# Patient Record
Sex: Female | Born: 1965 | Race: White | Hispanic: No | Smoking: Current every day smoker
Health system: Southern US, Community
[De-identification: ages and names within clinical notes are randomized; demographics above are authoritative.]

## PROBLEM LIST (undated history)

## (undated) DIAGNOSIS — Z800 Family history of malignant neoplasm of digestive organs: Secondary | ICD-10-CM

## (undated) DIAGNOSIS — E079 Disorder of thyroid, unspecified: Secondary | ICD-10-CM

## (undated) DIAGNOSIS — Z9221 Personal history of antineoplastic chemotherapy: Secondary | ICD-10-CM

## (undated) DIAGNOSIS — Z8543 Personal history of malignant neoplasm of ovary: Secondary | ICD-10-CM

## (undated) DIAGNOSIS — C569 Malignant neoplasm of unspecified ovary: Secondary | ICD-10-CM

## (undated) DIAGNOSIS — F419 Anxiety disorder, unspecified: Secondary | ICD-10-CM

## (undated) HISTORY — PX: COLONOSCOPY: SHX174

## (undated) HISTORY — DX: Family history of malignant neoplasm of digestive organs: Z80.0

## (undated) HISTORY — DX: Personal history of antineoplastic chemotherapy: Z92.21

## (undated) HISTORY — DX: Anxiety disorder, unspecified: F41.9

## (undated) HISTORY — DX: Personal history of malignant neoplasm of ovary: Z85.43

## (undated) HISTORY — DX: Disorder of thyroid, unspecified: E07.9

---

## 1985-01-20 HISTORY — PX: OTHER SURGICAL HISTORY: SHX169

## 1997-10-16 ENCOUNTER — Other Ambulatory Visit: Admission: RE | Admit: 1997-10-16 | Discharge: 1997-10-16 | Payer: Self-pay | Admitting: Obstetrics and Gynecology

## 1998-08-30 ENCOUNTER — Inpatient Hospital Stay (HOSPITAL_COMMUNITY): Admission: AD | Admit: 1998-08-30 | Discharge: 1998-08-30 | Payer: Self-pay | Admitting: Obstetrics and Gynecology

## 1998-09-23 ENCOUNTER — Inpatient Hospital Stay (HOSPITAL_COMMUNITY): Admission: AD | Admit: 1998-09-23 | Discharge: 1998-09-23 | Payer: Self-pay | Admitting: Obstetrics and Gynecology

## 1998-10-07 ENCOUNTER — Inpatient Hospital Stay (HOSPITAL_COMMUNITY): Admission: AD | Admit: 1998-10-07 | Discharge: 1998-10-09 | Payer: Self-pay | Admitting: Obstetrics and Gynecology

## 1998-11-06 ENCOUNTER — Other Ambulatory Visit: Admission: RE | Admit: 1998-11-06 | Discharge: 1998-11-06 | Payer: Self-pay | Admitting: Obstetrics and Gynecology

## 2000-01-21 HISTORY — PX: TOTAL ABDOMINAL HYSTERECTOMY: SHX209

## 2000-04-01 ENCOUNTER — Other Ambulatory Visit: Admission: RE | Admit: 2000-04-01 | Discharge: 2000-04-01 | Payer: Self-pay | Admitting: Obstetrics and Gynecology

## 2000-09-17 ENCOUNTER — Encounter (INDEPENDENT_AMBULATORY_CARE_PROVIDER_SITE_OTHER): Payer: Self-pay

## 2000-09-18 ENCOUNTER — Inpatient Hospital Stay (HOSPITAL_COMMUNITY): Admission: RE | Admit: 2000-09-18 | Discharge: 2000-09-19 | Payer: Self-pay | Admitting: Obstetrics and Gynecology

## 2001-04-20 ENCOUNTER — Other Ambulatory Visit: Admission: RE | Admit: 2001-04-20 | Discharge: 2001-04-20 | Payer: Self-pay | Admitting: Obstetrics and Gynecology

## 2002-08-04 ENCOUNTER — Other Ambulatory Visit: Admission: RE | Admit: 2002-08-04 | Discharge: 2002-08-04 | Payer: Self-pay | Admitting: Obstetrics and Gynecology

## 2005-01-22 ENCOUNTER — Other Ambulatory Visit: Admission: RE | Admit: 2005-01-22 | Discharge: 2005-01-22 | Payer: Self-pay | Admitting: Obstetrics and Gynecology

## 2008-10-25 ENCOUNTER — Ambulatory Visit: Payer: Self-pay | Admitting: Internal Medicine

## 2008-11-06 ENCOUNTER — Ambulatory Visit: Payer: Self-pay | Admitting: Internal Medicine

## 2009-06-08 ENCOUNTER — Telehealth: Payer: Self-pay | Admitting: Internal Medicine

## 2010-02-21 NOTE — Progress Notes (Signed)
Summary: Schedule Colonoscopy  Phone Note Outgoing Call Call back at [facility] Phone [phone]   Call placed by: Harlow Mares CMA Duncan Dull),  Jun 08, 2009 10:29 AM Call placed to: Patient Summary of Call: Left message on patients machine to call back. patient is due for colonoscopy, she NOS and cx her colonoscopy last year.  Initial call taken by: Harlow Mares CMA Duncan Dull),  Jun 08, 2009 10:30 AM  Follow-up for Phone Call        Left message on patients machine to call back. we will mail her a reminder letter to schedule her colonoscopy Follow-up by: Harlow Mares CMA Duncan Dull),  July 02, 2009 12:43 PM

## 2010-06-07 NOTE — H&P (Signed)
[facility]  Patient:    Rhonda Strickland, H[place] Visit Number: [id number] MRN: [id number]          Service Type: Attending:  Juluis Mire, M.D. Dictated by:   Juluis Mire, M.D. Adm. Date:  09/17/00                           History and Physical  CHIEF COMPLAINT:              The patient is a 45 year old gravida 5 para 3 abortus 2 married white female, who presents for laparoscopically assisted vaginal hysterectomy with left salpingo-oophorectomy.  HISTORY OF PRESENT ILLNESS:   In relation to the present admission, the patient reports abnormal uterine bleeding.  She describes regular cycles.  Her flow lasts seven days.  She describes seven days of heavy flow, changing pads and tampons every hour to 1-1/2 hours.  She denies any significant clots.  She does have pain and discomfort associated with her periods.  This pain and abnormal bleeding is limiting at times, causing he to miss work.  She is using over-the-counter agents without much response.  We are limited somewhat because she does smoke and therefore birth control pills are contraindicated. She underwent an ultrasound evaluation that revealed overall increase in uterine size suggestive of adenomyosis.  In view of our inability to treat her hormonally and the continued issue with her periods she presents now for definitive therapy in the form of laparoscopically-assisted vaginal hysterectomy.  PAST MEDICAL HISTORY:         Previous right salpingo-oophorectomy done in 1988.  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  She underwent subsequent chemotherapy.  We had talked to the [facility] gynecologic oncologist, who stated that she did not need prophylactic surgery for this.  However, the patient does have basic concerns and wants to the remaining ovary removed at this time.  She does understand the need for hormone replacement therapy.  The  recent issues related to recent studies in terms of risk of hormone replacement therapy have been discussed.  ALLERGIES:                    No known drug allergies.  MEDICATIONS:                  Xanax.  PAST MEDICAL HISTORY:         Usual childhood diseases without significant sequelae.  PAST SURGICAL HISTORY:        1. Wisdom teeth removed.                               2. In 1988 underwent exploratory laparotomy                                  with removal of right tube and ovary.  PAST OBSTETRICAL HISTORY:     1. Two TABs.                               2. Three spontaneous vaginal deliveries.  FAMILY HISTORY:               Basically unremarkable.  SOCIAL HISTORY:  Half a pack per day tobacco use.  No alcohol use.  REVIEW OF SYSTEMS:            Noncontributory.  PHYSICAL EXAMINATION:  VITAL SIGNS:                  Afebrile with stable vital signs.  HEENT:                        Normocephalic.  PERRLA.  EOMI.  Sclerae and conjunctivae clear.  Oropharynx clear.  NECK:                         Without thyromegaly.  BREAST:                       No discrete masses.  LUNGS:                        Clear.  CARDIAC:                      Regular rate and rhythm.  No murmurs or gallops.  ABDOMEN:                      Benign.  No masses, organomegaly, or tenderness. Did have a midline incision from previous exploratory surgery.  PELVIC:                       Normal external genitalia.  Vaginal mucosa clear.  Cervix unremarkable.  Uterus upper limits of normal size, posterior. Adnexa unremarkable.  EXTREMITIES:                  Trace edema.  NEUROLOGIC:                   Grossly within normal limits.  IMPRESSION:                   1. Menorrhagia and increasing pelvic pain,                                  possible uterine adenomyosis versus pelvic                                  endometriosis.                               2. Previous right  salpingo-oophorectomy with                                  removal of an immature teratoma.  PLAN:                         The patient will undergo attempt at laparoscopically assisted vaginal hysterectomy with left salpingo-oophorectomy.  She does understand that due to the midline adhesion and possible adhesions a vaginal approach may be contraindicated and abdominal surgery may be required.  The risks of surgery have been discussed including the risks of anesthesia, the risk of infection, the risk of hemorrhage that could necessitate transfusion with the risk of AIDS  or hepatitis, risk of injury to adjacent organs including bladder, bowel, or ureters that could require further exploratory surgery, the risk of deep vein thrombosis and pulmonary embolus.  She also understands the need for hormone replacement therapy with its risks and benefits. Dictated by:   Juluis Mire, M.D. Attending:  Juluis Mire, M.D. DD:  09/17/00 TD:  09/17/00 Job: [id number]

## 2010-06-07 NOTE — Discharge Summary (Signed)
[facility]  Patient:    Rhonda Strickland, Rhonda Strickland Visit Number: [id number] MRN: [id number]          Service Type: GYN Location: 9300 [id number] Attending Physician:  Frederich Balding Dictated by:   Juluis Mire, M.D. Admit Date:  09/17/2000 Disc. Date: 09/19/00                             Discharge Summary  ADMISSION DIAGNOSES:          1. Menorrhagia and dysmenorrhea secondary to                                  pelvic endometriosis, possibly uterine                                  adenomyosis.                               2. Previous history of an immature teratoma with                                  subsequent right salpingo-oophorectomy.  POSTOPERATIVE DIAGNOSES:       1. Menorrhagia and dysmenorrhea secondary to                                  pelvic endometriosis, possibly uterine                                  adenomyosis.                               2. Previous history of an immature teratoma with                                  subsequent right salpingo-oophorectomy.  OPERATIVE PROCEDURES:         1. Laparoscopically-assisted vaginal                                  hysterectomy.                               2. Left salpingo-oophorectomy.  HISTORY OF PRESENT ILLNESS:   For summary of admission history and physical, please see the dictated note.  HOSPITAL COURSE:              The patient underwent the above-noted surgery. Pathology revealed a 78 g uterus.  The uterus, tubes, and ovaries were unremarkable.  She did have evidence of pelvic endometriosis intraoperatively. Postoperatively, she did well.  Her postoperative hemoglobin was 11.4 with a white count of 7700.  She was discharged home on her second postoperative day. It was of note that she did run a fever during the evening of her second postoperative day up to a maximum of  100.6 degrees.  That morning, it was 99.5 degrees.  She did have a history of tobacco use and came in  with a mild bronchitis.  On evaluation, she did have diffuse rales and rhonchi consistent with ongoing bronchitis.  Her abdomen was soft and nontender.  Bowel sounds were active.  She had normal bowel function postoperatively.  No urinary complaints.  No CVA tenderness.  She had no active vaginal bleeding at this point in time.  It was felt that the temperature was pulmonary in origin and we are going to go ahead with the discharge despite the previous elevation of temperature.  In terms of complications, none were encountered during her stay in the hospital and she was discharged home in stable condition.  DISPOSITION:                  The patient is discharged home.  She will complete a course of ciprofloxacin for the bronchitis.  She has an inhaler at home already.  She is also discharged home on Tylox as needed for pain and a Climara estrogen patch.  DISCHARGE INSTRUCTIONS:       She is to avoid heavy lifting, vaginal entry, and driving a car.  She will watch her fever closely.  Persistent elevations should require re-evaluation.  She is to call with any nausea, vomiting, increasing abdominal pain, or active vaginal bleeding.  In terms of the hormone replacement therapy, the risks and benefits have been discussed. Follow-up in the office should be in one week. Dictated by:   Juluis Mire, M.D. Attending Physician:  Frederich Balding DD:  09/19/00 TD:  09/19/00 Job: [id number]

## 2010-06-07 NOTE — Op Note (Signed)
[facility]  Patient:    Rhonda Strickland, Rhonda Strickland Visit Number: [id number] MRN: [id number]          Service Type: DSU Location: 9300 [id number] Attending Physician:  Frederich Balding Proc. Date: 09/17/00 Admit Date:  09/17/2000                             Operative Report  PREOPERATIVE DIAGNOSES:       1. Menorrhagia and dysmenorrhea thought to be                                  secondary to uterine adenomyosis and possible                                  pelvic endometriosis.                               2. Past history of immature teratoma of the                                  right ovary.  POSTOPERATIVE DIAGNOSES:      1. Menorrhagia and dysmenorrhea thought to be                                  secondary to uterine adenomyosis and possible                                  pelvic endometriosis.                               2. Past history of immature teratoma of the                                  right ovary.  PROCEDURE:                    Open laparoscopy with laparoscopic assisted vaginal hysterectomy and left salpingo-oophorectomy.  SURGEON:                      Juluis Mire, M.D.  ASSISTANTWilley Blade, M.D.  ANESTHESIA:                   General endotracheal.  ESTIMATED BLOOD LOSS:         300-400 cc.  PACKS AND DRAINS:             None.  BLOOD REPLACED:               None.  COMPLICATIONS:                None.  INDICATIONS:                  As dictated in the history and physical.  PROCEDURE:  Patient was taken to the OR and placed in the supine position.  After a satisfactory level of general endotracheal anesthesia was obtained the patient was placed in the dorsal lithotomy position using the Allen stirrups.  At this point in time the abdomen, perineum, and vagina were prepped out with Betadine.  Examination under anesthesia revealed the uterus to be mid position, normal size and  shape with no adnexal masses.  Bladder was emptied ______ catheterization.  Hulka tenaculum was put in place and the patient was draped out for laparoscopy.  A subumbilical incision was made with the knife and carried through the subcutaneous tissue.  The fascia was identified and entered sharply.  The peritoneum was identified and entered sharply.  There was no evidence of any periumbilical adhesions.  Two lateral sutures of 0 Vicryl were put into the fascia and held.  These were used to secure the disposable Hasson cannula. The laparoscope was introduced and the abdomen was inflated with carbon dioxide.  There were no umbilical adhesions noted.  A 5 mm trocar was put into place in the suprapubic area.  The uterus was then elevated.  The uterus was enlarged.  The right tube and ovary were surgically absent.  Left tube and ovary appeared to be normal.  There were some implants of endometriosis in the cul-de-sac area.  The appendix was not visualized and may have been removed at the time of her previous surgery.  There were some parasacral adhesions. Upper abdomen including liver and tip of the gallbladder were clear.  A third 5 mm trocar was put in place in the left lower quadrant after visualization of the epigastric vessels.  Next, a visualization revealed the left adnexa.  We grasped the ovary and pulled it up from the pelvic side wall.  A 5 mm scope was then put in place in a trocar in the left lower quadrant.  The ligature was then brought through the subumbilical port.  We will use this to cauterize and ligate the left ovarian vasculature up and including the left round ligament.  We then also used it to cauterize and incise the right round ligament.  With this we had good separation of the adnexa and no active vaginal bleeding.  Ureter was easily identified on both sides and uninvolved in the operative process.  At this point in time we decided to proceed vaginally.  The abdomen was  deflated of its carbon dioxide.  The laparoscope was then removed along with the ligature.  The patients legs were then repositioned.  Hulka tenaculum was removed.  A weighted speculum was placed in the vaginal vault.  The cervix was grasped with Christella Hartigan tenaculum.  The cul-de-sac was entered sharply.  Both uterosacral ligaments were clamped, cut, and suture ligated with 0 Vicryl.  These were held.  The reflection of the vaginal mucosa anteriorly was then incised. Paracervical tissue was clamped, cut, and suture ligated with 0 Vicryl.  We then advanced the bladder.  The vesicouterine space was identified and entered sharply.  Using the clamp, cut, and tie technique with a suture ligature of 0 Vicryl the parametrium was serially separated from the sides of the uterus. The uterus was then flipped.  Remaining pedicles were clamped and cut and the uterus was passed off the operative field including the left tube and ovary. Held pedicles secured first with a free tie of 0 Vicryl, then a suture ligature of 0 Vicryl.  Areas of bleeding were brought under control with suture  ligatures of 0 Vicryl.  Posterior vaginal cuff was then run with a running locking suture of 0 Vicryl.  With this we had fair hemostasis.  The vaginal mucosa was closed in a vertical fashion in the midline with interrupted figure-of-eight of 2-0 Vicryl.  Foley was placed to straight drain which revealed an adequate amount of clear urine.  A sponge ______ stick was then placed in the vaginal vault.  The patients legs were repositioned.  The laparoscope was reintroduced.  The abdomen was insufflated with carbon dioxide.  Visualization revealed excellent hemostasis, both in the left adnexa and the cuff.  There was some slight bleeding from the bladder blades brought under control with the bipolar.  With this we had excellent hemostasis.  The abdomen was deflated of its carbon dioxide.  All trocars removed.  Subumbilical incision  was closed with interrupted subcuticulars of 4-0 Vicryl.  Suprapubic incision was closed with Steri-Strips.  Sponge ______ sponge stick was then removed from the vaginal  vault.  The patient was taken out of the dorsal lithotomy.  Once extubated and alert transferred to recovery room in good condition.  Sponge, instrument, needle counts correct by circulated nurse x 2. Attending Physician:  Frederich Balding DD:  09/18/00 TD:  09/18/00 Job: [id number]

## 2011-06-24 ENCOUNTER — Encounter: Payer: Self-pay | Admitting: Internal Medicine

## 2017-02-24 DIAGNOSIS — E039 Hypothyroidism, unspecified: Secondary | ICD-10-CM | POA: Diagnosis not present

## 2017-02-24 DIAGNOSIS — Z1231 Encounter for screening mammogram for malignant neoplasm of breast: Secondary | ICD-10-CM | POA: Diagnosis not present

## 2017-02-24 DIAGNOSIS — Z23 Encounter for immunization: Secondary | ICD-10-CM | POA: Diagnosis not present

## 2017-02-24 DIAGNOSIS — F17200 Nicotine dependence, unspecified, uncomplicated: Secondary | ICD-10-CM | POA: Diagnosis not present

## 2017-02-24 DIAGNOSIS — F419 Anxiety disorder, unspecified: Secondary | ICD-10-CM | POA: Diagnosis not present

## 2017-02-26 ENCOUNTER — Other Ambulatory Visit: Payer: Self-pay | Admitting: Family Medicine

## 2017-02-26 DIAGNOSIS — Z139 Encounter for screening, unspecified: Secondary | ICD-10-CM

## 2017-03-17 ENCOUNTER — Ambulatory Visit
Admission: RE | Admit: 2017-03-17 | Discharge: 2017-03-17 | Disposition: A | Discharge status: Home / Self Care | Payer: BLUE CROSS/BLUE SHIELD | Source: Ambulatory Visit | Attending: Family Medicine | Admitting: Family Medicine

## 2017-03-17 DIAGNOSIS — Z1231 Encounter for screening mammogram for malignant neoplasm of breast: Secondary | ICD-10-CM | POA: Diagnosis not present

## 2017-03-17 DIAGNOSIS — Z139 Encounter for screening, unspecified: Secondary | ICD-10-CM

## 2017-03-17 HISTORY — DX: Malignant neoplasm of unspecified ovary: C56.9

## 2017-08-31 DIAGNOSIS — F17200 Nicotine dependence, unspecified, uncomplicated: Secondary | ICD-10-CM | POA: Diagnosis not present

## 2017-08-31 DIAGNOSIS — F419 Anxiety disorder, unspecified: Secondary | ICD-10-CM | POA: Diagnosis not present

## 2017-08-31 DIAGNOSIS — N183 Chronic kidney disease, stage 3 (moderate): Secondary | ICD-10-CM | POA: Diagnosis not present

## 2017-08-31 DIAGNOSIS — E039 Hypothyroidism, unspecified: Secondary | ICD-10-CM | POA: Diagnosis not present

## 2018-02-09 ENCOUNTER — Other Ambulatory Visit: Payer: Self-pay | Admitting: Family Medicine

## 2018-02-09 DIAGNOSIS — Z1231 Encounter for screening mammogram for malignant neoplasm of breast: Secondary | ICD-10-CM

## 2018-03-04 DIAGNOSIS — Z13220 Encounter for screening for lipoid disorders: Secondary | ICD-10-CM | POA: Diagnosis not present

## 2018-03-04 DIAGNOSIS — F419 Anxiety disorder, unspecified: Secondary | ICD-10-CM | POA: Diagnosis not present

## 2018-03-04 DIAGNOSIS — E039 Hypothyroidism, unspecified: Secondary | ICD-10-CM | POA: Diagnosis not present

## 2018-03-04 DIAGNOSIS — N183 Chronic kidney disease, stage 3 (moderate): Secondary | ICD-10-CM | POA: Diagnosis not present

## 2018-03-04 DIAGNOSIS — F17210 Nicotine dependence, cigarettes, uncomplicated: Secondary | ICD-10-CM | POA: Diagnosis not present

## 2018-03-18 ENCOUNTER — Encounter: Payer: Self-pay | Admitting: Gastroenterology

## 2018-03-19 ENCOUNTER — Ambulatory Visit
Admission: RE | Admit: 2018-03-19 | Discharge: 2018-03-19 | Disposition: A | Discharge status: Home / Self Care | Payer: BLUE CROSS/BLUE SHIELD | Source: Ambulatory Visit | Attending: Family Medicine | Admitting: Family Medicine

## 2018-03-19 DIAGNOSIS — Z1231 Encounter for screening mammogram for malignant neoplasm of breast: Secondary | ICD-10-CM

## 2018-03-22 ENCOUNTER — Telehealth: Payer: Self-pay | Admitting: *Deleted

## 2018-03-22 NOTE — Telephone Encounter (Signed)
Phone call to patient to move PV appointment. No answer. Left message for patient to return call to reschedule appointment.

## 2018-03-23 ENCOUNTER — Other Ambulatory Visit: Payer: Self-pay

## 2018-03-23 ENCOUNTER — Ambulatory Visit (AMBULATORY_SURGERY_CENTER): Payer: Self-pay | Admitting: *Deleted

## 2018-03-23 ENCOUNTER — Encounter: Payer: Self-pay | Admitting: Gastroenterology

## 2018-03-23 VITALS — Ht 65.0 in | Wt 197.0 lb

## 2018-03-23 DIAGNOSIS — Z800 Family history of malignant neoplasm of digestive organs: Secondary | ICD-10-CM

## 2018-03-23 MED ORDER — PEG-KCL-NACL-NASULF-NA ASC-C 140 G PO SOLR: 1.0000 | ORAL | 0 refills | Status: DC

## 2018-03-23 NOTE — Progress Notes (Signed)
No egg or soy allergy known to patient  No issues with past sedation with any surgeries  or procedures, no intubation problems  No diet pills per patient No home 02 use per patient  No blood thinners per patient  Pt denies issues with constipation  No A fib or A flutter  EMMI video sent to pt's e mail - declined  Plenvu univ coupon to pt

## 2018-04-05 ENCOUNTER — Telehealth: Payer: Self-pay | Admitting: *Deleted

## 2018-04-05 NOTE — Telephone Encounter (Signed)
Pt called answering service this morning to cancel procedure as she has diarrhea and fever, she will call back to reschedule her procedure another time.

## 2018-04-06 ENCOUNTER — Encounter: Payer: BLUE CROSS/BLUE SHIELD | Admitting: Gastroenterology

## 2018-09-08 DIAGNOSIS — F17210 Nicotine dependence, cigarettes, uncomplicated: Secondary | ICD-10-CM | POA: Diagnosis not present

## 2018-09-08 DIAGNOSIS — N183 Chronic kidney disease, stage 3 (moderate): Secondary | ICD-10-CM | POA: Diagnosis not present

## 2018-09-08 DIAGNOSIS — F419 Anxiety disorder, unspecified: Secondary | ICD-10-CM | POA: Diagnosis not present

## 2018-09-08 DIAGNOSIS — E039 Hypothyroidism, unspecified: Secondary | ICD-10-CM | POA: Diagnosis not present

## 2018-10-04 DIAGNOSIS — Z20828 Contact with and (suspected) exposure to other viral communicable diseases: Secondary | ICD-10-CM | POA: Diagnosis not present

## 2019-09-08 DIAGNOSIS — N183 Chronic kidney disease, stage 3 unspecified: Secondary | ICD-10-CM | POA: Diagnosis not present

## 2019-09-08 DIAGNOSIS — F419 Anxiety disorder, unspecified: Secondary | ICD-10-CM | POA: Diagnosis not present

## 2019-09-08 DIAGNOSIS — E039 Hypothyroidism, unspecified: Secondary | ICD-10-CM | POA: Diagnosis not present

## 2019-09-08 DIAGNOSIS — F17200 Nicotine dependence, unspecified, uncomplicated: Secondary | ICD-10-CM | POA: Diagnosis not present

## 2019-11-07 ENCOUNTER — Encounter: Payer: Self-pay | Admitting: Gastroenterology

## 2019-12-26 ENCOUNTER — Other Ambulatory Visit: Payer: Self-pay

## 2019-12-26 ENCOUNTER — Ambulatory Visit (AMBULATORY_SURGERY_CENTER): Payer: Self-pay | Admitting: *Deleted

## 2019-12-26 VITALS — Ht 65.0 in | Wt 181.0 lb

## 2019-12-26 DIAGNOSIS — Z1211 Encounter for screening for malignant neoplasm of colon: Secondary | ICD-10-CM

## 2019-12-26 DIAGNOSIS — Z800 Family history of malignant neoplasm of digestive organs: Secondary | ICD-10-CM

## 2019-12-26 MED ORDER — PLENVU 140 G PO SOLR: 1.0000 | Freq: Once | ORAL | 0 refills | Status: AC

## 2019-12-26 NOTE — Progress Notes (Signed)
Patient is here in-person for PV. Patient denies any allergies to eggs or soy. Patient denies any problems with anesthesia/sedation. Patient denies any oxygen use at home. Patient denies taking any diet/weight loss medications or blood thinners. Patient is not being treated for MRSA or C-diff. Patient is aware of our care-partner policy and WPVXY-80 safety protocol.   COVID-19 vaccines completed on 10/2019 booster, per patient.   Prep Prescription coupon given to the patient.patient denies any constipation.

## 2019-12-27 ENCOUNTER — Encounter: Payer: Self-pay | Admitting: Gastroenterology

## 2020-01-09 ENCOUNTER — Encounter: Payer: BLUE CROSS/BLUE SHIELD | Admitting: Gastroenterology

## 2020-01-10 ENCOUNTER — Telehealth: Payer: Self-pay

## 2020-01-10 ENCOUNTER — Other Ambulatory Visit: Payer: Self-pay

## 2020-01-10 ENCOUNTER — Ambulatory Visit (AMBULATORY_SURGERY_CENTER): Payer: BC Managed Care – PPO | Admitting: Gastroenterology

## 2020-01-10 ENCOUNTER — Encounter: Payer: Self-pay | Admitting: Gastroenterology

## 2020-01-10 VITALS — BP 116/66 | HR 74 | Temp 97.8°F | Resp 17 | Ht 65.0 in | Wt 181.0 lb

## 2020-01-10 DIAGNOSIS — Z800 Family history of malignant neoplasm of digestive organs: Secondary | ICD-10-CM

## 2020-01-10 DIAGNOSIS — D122 Benign neoplasm of ascending colon: Secondary | ICD-10-CM | POA: Diagnosis not present

## 2020-01-10 DIAGNOSIS — Z8543 Personal history of malignant neoplasm of ovary: Secondary | ICD-10-CM

## 2020-01-10 DIAGNOSIS — Z1211 Encounter for screening for malignant neoplasm of colon: Secondary | ICD-10-CM

## 2020-01-10 MED ORDER — SODIUM CHLORIDE 0.9 % IV SOLN: 500.0000 mL | Freq: Once | INTRAVENOUS | Status: DC

## 2020-01-10 NOTE — Patient Instructions (Addendum)
Handout given:  Polyps, Diverticulosis Continue present medications Await pathology results  YOU HAD AN ENDOSCOPIC PROCEDURE TODAY AT [facility]:   Refer to the procedure report that was given to you for any specific questions about what was found during the examination.  If the procedure report does not answer your questions, please call your gastroenterologist to clarify.  If you requested that your care partner not be given the details of your procedure findings, then the procedure report has been included in a sealed envelope for you to review at your convenience later.  YOU SHOULD EXPECT: Some feelings of bloating in the abdomen. Passage of more gas than usual.  Walking can help get rid of the air that was put into your GI tract during the procedure and reduce the bloating. If you had a lower endoscopy (such as a colonoscopy or flexible sigmoidoscopy) you may notice spotting of blood in your stool or on the toilet paper. If you underwent a bowel prep for your procedure, you may not have a normal bowel movement for a few days.  Please Note:  You might notice some irritation and congestion in your nose or some drainage.  This is from the oxygen used during your procedure.  There is no need for concern and it should clear up in a day or so.  SYMPTOMS TO REPORT IMMEDIATELY:   Following lower endoscopy (colonoscopy or flexible sigmoidoscopy):  Excessive amounts of blood in the stool  Significant tenderness or worsening of abdominal pains  Swelling of the abdomen that is new, acute  Fever of 100F or higher  For urgent or emergent issues, a gastroenterologist can be reached at any hour by calling [phone]. Do not use MyChart messaging for urgent concerns.   DIET:  We do recommend a small meal at first, but then you may proceed to your regular diet.  Drink plenty of fluids but you should avoid alcoholic beverages for 24 hours.  ACTIVITY:  You should plan to take it easy  for the rest of today and you should NOT DRIVE or use heavy machinery until tomorrow (because of the sedation medicines used during the test).    FOLLOW UP: Our staff will call the number listed on your records 48-72 hours following your procedure to check on you and address any questions or concerns that you may have regarding the information given to you following your procedure. If we do not reach you, we will leave a message.  We will attempt to reach you two times.  During this call, we will ask if you have developed any symptoms of COVID 19. If you develop any symptoms (ie: fever, flu-like symptoms, shortness of breath, cough etc.) before then, please call [phone].  If you test positive for Covid 19 in the 2 weeks post procedure, please call and report this information to [place].    If any biopsies were taken you will be contacted by phone or by letter within the next 1-3 weeks.  Please call us at [phone] if you have not heard about the biopsies in 3 weeks.   SIGNATURES/CONFIDENTIALITY: You and/or your care partner have signed paperwork which will be entered into your electronic medical record.  These signatures attest to the fact that that the information above on your After Visit Summary has been reviewed and is understood.  Full responsibility of the confidentiality of this discharge information lies with you and/or your care-partner.YOU HAD AN ENDOSCOPIC PROCEDURE TODAY AT THE [facility]:   Refer to the procedure report that was given to you for any specific questions about what was found during the examination.  If the procedure report does not answer your questions, please call your gastroenterologist to clarify.  If you requested that your care partner not be given the details of your procedure findings, then the procedure report has been included in a sealed envelope for you to review at your convenience later.  YOU SHOULD EXPECT: Some feelings of bloating in the  abdomen. Passage of more gas than usual.  Walking can help get rid of the air that was put into your GI tract during the procedure and reduce the bloating. If you had a lower endoscopy (such as a colonoscopy or flexible sigmoidoscopy) you may notice spotting of blood in your stool or on the toilet paper. If you underwent a bowel prep for your procedure, you may not have a normal bowel movement for a few days.  Please Note:  You might notice some irritation and congestion in your nose or some drainage.  This is from the oxygen used during your procedure.  There is no need for concern and it should clear up in a day or so.  SYMPTOMS TO REPORT IMMEDIATELY:   Following lower endoscopy (colonoscopy or flexible sigmoidoscopy):  Excessive amounts of blood in the stool  Significant tenderness or worsening of abdominal pains  Swelling of the abdomen that is new, acute  Fever of 100F or higher  For urgent or emergent issues, a gastroenterologist can be reached at any hour by calling [phone]. Do not use MyChart messaging for urgent concerns.   DIET:  We do recommend a small meal at first, but then you may proceed to your regular diet.  Drink plenty of fluids but you should avoid alcoholic beverages for 24 hours.  ACTIVITY:  You should plan to take it easy for the rest of today and you should NOT DRIVE or use heavy machinery until tomorrow (because of the sedation medicines used during the test).    FOLLOW UP: Our staff will call the number listed on your records 48-72 hours following your procedure to check on you and address any questions or concerns that you may have regarding the information given to you following your procedure. If we do not reach you, we will leave a message.  We will attempt to reach you two times.  During this call, we will ask if you have developed any symptoms of COVID 19. If you develop any symptoms (ie: fever, flu-like symptoms, shortness of breath, cough etc.) before  then, please call [phone].  If you test positive for Covid 19 in the 2 weeks post procedure, please call and report this information to [place].    If any biopsies were taken you will be contacted by phone or by letter within the next 1-3 weeks.  Please call us at [phone] if you have not heard about the biopsies in 3 weeks.   SIGNATURES/CONFIDENTIALITY: You and/or your care partner have signed paperwork which will be entered into your electronic medical record.  These signatures attest to the fact that that the information above on your After Visit Summary has been reviewed and is understood.  Full responsibility of the confidentiality of this discharge information lies with you and/or your care-partner.

## 2020-01-10 NOTE — Op Note (Signed)
[facility] Patient Name: Rhonda Strickland Procedure Date: 01/10/2020 8:33 AM MRN: [id number] Endoscopist: Thornton Park MD, MD Age: 54 Referring MD:  Date of Birth: [birth year] Gender: Female Account #: [id number] Procedure:                Colonoscopy Indications:              Screening in patient at increased risk: Family                            history of 1st-degree relative with colorectal                            cancer before age 90 years                           Normal colonoscopy with Dr. Olevia Perches 2005                           Sister with colon cancer at age 32                           Personal history of ovarian cancer at age 63                           No other known family history of colon cancer or                            polyps Medicines:                Monitored Anesthesia Care Procedure:                Pre-Anesthesia Assessment:                           - Prior to the procedure, a History and Physical                            was performed, and patient medications and                            allergies were reviewed. The patient's tolerance of                            previous anesthesia was also reviewed. The risks                            and benefits of the procedure and the sedation                            options and risks were discussed with the patient.                            All questions were answered, and informed consent  was obtained. Prior Anticoagulants: The patient has                            taken no previous anticoagulant or antiplatelet                            agents. ASA Grade Assessment: II - A patient with                            mild systemic disease. After reviewing the risks                            and benefits, the patient was deemed in                            satisfactory condition to undergo the procedure.                           After obtaining informed consent,  the colonoscope                            was passed under direct vision. Throughout the                            procedure, the patient's blood pressure, pulse, and                            oxygen saturations were monitored continuously. The                            Colonoscope was introduced through the anus and                            advanced to the 3 cm into the ileum. A second                            forward view of the right colon was performed. The                            colonoscopy was performed without difficulty. The                            patient tolerated the procedure well. The quality                            of the bowel preparation was good. The terminal                            ileum, ileocecal valve, appendiceal orifice, and                            rectum were photographed. Scope In: 8:37:51 AM Scope Out: 8:52:26 AM Scope Withdrawal Time: 0 hours 10 minutes 16 seconds  Total Procedure Duration: 0 hours 14 minutes 35 seconds  Findings:                 The perianal and digital rectal examinations were                            normal except for small external hemorrhoids.                           A few small and large-mouthed diverticula were                            found in the sigmoid colon.                           A 1 mm polyp was found in the proximal ascending                            colon. The polyp was flat. The polyp was removed                            with a cold biopsy forceps. Resection and retrieval                            were complete. Estimated blood loss was minimal.                           The exam was otherwise without abnormality on                            direct and retroflexion views. Complications:            No immediate complications. Estimated blood loss:                            Minimal. Estimated Blood Loss:     Estimated blood loss was minimal. Impression:               - Diverticulosis in the  sigmoid colon.                           - One 1 mm polyp in the proximal ascending colon,                            removed with a cold biopsy forceps. Resected and                            retrieved.                           - The examination was otherwise normal on direct                            and retroflexion views. Recommendation:           - Patient has a contact number available for  emergencies. The signs and symptoms of potential                            delayed complications were discussed with the                            patient. Return to normal activities tomorrow.                            Written discharge instructions were provided to the                            patient.                           - Follow a high fiber diet. Drink at least 64                            ounces of water daily. Add a daily stool bulking                            agent such as psyllium (an exampled would be                            Metamucil).                           - Continue present medications.                           - Await pathology results.                           - Repeat colonoscopy in 5 years for surveillance.                            Referral to genetic counselor recommended. Will                            revise surveillance endoscopy after reviewing those                            results.                           - Emerging evidence supports eating a diet of                            fruits, vegetables, grains, calcium, and yogurt                            while reducing red meat and alcohol may reduce the                            risk of colon cancer.                           -  Thank you for allowing me to be involved in your                            colon cancer prevention. Thornton Park MD, MD 01/10/2020 8:58:49 AM This report has been signed electronically.

## 2020-01-10 NOTE — Telephone Encounter (Signed)
-----   Message from Thornton Park, MD sent at 01/10/2020  8:58 AM EST ----- Good morning! Please arrange referral for genetic counseling: patient with history of ovarian cancer at age 54, sister with colon cancer at age 2  Thank you!  KLB

## 2020-01-10 NOTE — Progress Notes (Signed)
PT taken to PACU. Monitors in place. VSS. Report given to RN. 

## 2020-01-10 NOTE — Progress Notes (Signed)
Pt's states no medical or surgical changes since previsit or office visit.  VS CW  

## 2020-01-10 NOTE — Telephone Encounter (Signed)
Referral entered in epic for genetics counseling.

## 2020-01-10 NOTE — Progress Notes (Signed)
Called to room to assist during endoscopic procedure.  Patient ID and intended procedure confirmed with present staff. Received instructions for my participation in the procedure from the performing physician.  

## 2020-01-12 ENCOUNTER — Telehealth: Payer: Self-pay

## 2020-01-12 NOTE — Telephone Encounter (Signed)
Follow up call to pt, lm on vm. 

## 2020-01-19 ENCOUNTER — Encounter: Payer: Self-pay | Admitting: Gastroenterology

## 2020-01-19 ENCOUNTER — Telehealth: Payer: Self-pay | Admitting: Genetic Counselor

## 2020-01-19 NOTE — Telephone Encounter (Signed)
Received a genetic counseling referral from Dr. Orvan Falconer for fhx of colon cancer and hx of ovarian cancer. Pt has been cld and scheduled to see Clydie Braun on 1/19 at 10am. Pt aware to arrive 15 minutes early.

## 2020-02-08 ENCOUNTER — Encounter: Payer: Self-pay | Admitting: Genetic Counselor

## 2020-02-08 ENCOUNTER — Other Ambulatory Visit: Payer: BC Managed Care – PPO

## 2020-02-08 ENCOUNTER — Inpatient Hospital Stay: Payer: Self-pay | Attending: Genetic Counselor | Admitting: Genetic Counselor

## 2020-02-08 ENCOUNTER — Other Ambulatory Visit: Payer: Self-pay | Admitting: Genetic Counselor

## 2020-02-08 DIAGNOSIS — Z8041 Family history of malignant neoplasm of ovary: Secondary | ICD-10-CM

## 2020-02-08 DIAGNOSIS — Z8543 Personal history of malignant neoplasm of ovary: Secondary | ICD-10-CM | POA: Insufficient documentation

## 2020-02-08 DIAGNOSIS — Z800 Family history of malignant neoplasm of digestive organs: Secondary | ICD-10-CM | POA: Insufficient documentation

## 2020-02-08 NOTE — Progress Notes (Signed)
REFERRING PROVIDER: Thornton Park, MD [place],  [place] [address]  PRIMARY PROVIDER:  Shirline Frees, MD  PRIMARY REASON FOR VISIT:  1. Family history of colon cancer   2. Personal history of ovarian cancer      HISTORY OF PRESENT ILLNESS:  I connected with  Rhonda Strickland on 02/08/2020 at 10:15 AM EDT by MyChart video conference and verified that I am speaking with the correct person using two identifiers.   Patient location: Home Provider location: [facility]   Rhonda Strickland, a 55 y.o. female, was seen for a [facility] cancer genetics consultation at the request of Dr. Tarri Glenn due to a personal and family history of cancer.  Rhonda Strickland presents to clinic today to discuss the possibility of a hereditary predisposition to cancer, genetic testing, and to further clarify her future cancer risks, as well as potential cancer risks for family members.   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 The treatment plan included surgery and removal of her right fallopian tube and ovary and chemotherapy.  She started having colonoscopies in her late 78's due to the family history of colon cancer.    CANCER HISTORY:  Oncology History   No history exists.     Past Medical History:  Diagnosis Date  . Anxiety   . Family history of colon cancer   . History of chemotherapy    1987 with ovarian cancer, no radiation  . Ovarian cancer [facility])    ovarian cancer- age 39 - left ovary removed, chemo May 24, 1985  . Personal history of ovarian cancer   . Thyroid disease     Past Surgical History:  Procedure Laterality Date  . COLONOSCOPY     last 2005 DB   . ovary removed  1987  . TOTAL ABDOMINAL HYSTERECTOMY  2002    Social History   Socioeconomic History  . Marital status: Widowed    Spouse name: Not on file  . Number of children: Not on file  . Years of education: Not on file  . Highest education level: Not on file  Occupational  History  . Not on file  Tobacco Use  . Smoking status: Current Every Day Smoker    Packs/day: 0.50    Types: Cigarettes  . Smokeless tobacco: Never Used  Vaping Use  . Vaping Use: Never used  Substance and Sexual Activity  . Alcohol use: Not Currently  . Drug use: Never  . Sexual activity: Not on file  Other Topics Concern  . Not on file  Social History Narrative  . Not on file   Social Determinants of Health   Financial Resource Strain: Not on file  Food Insecurity: Not on file  Transportation Needs: Not on file  Physical Activity: Not on file  Stress: Not on file  Social Connections: Not on file     FAMILY HISTORY:  We obtained a detailed, 4-generation family history.  Significant diagnoses are listed below: Family History  Problem Relation Age of Onset  . Colon cancer Sister 12  . Colon polyps Sister   . Lung cancer Mother   . Lung cancer Father   . Colon cancer Maternal Grandmother        probable colon cancer at 85  . Colon cancer Maternal Uncle 61  . Breast cancer Neg Hx   . Esophageal cancer Neg Hx   . Rectal cancer Neg Hx   .  Stomach cancer Neg Hx     The patient has two sons who are cancer free.  She has two sisters and a brother.  One sister had colon cancer at 58.  She has not had genetic testing.  Both parents are deceased from lung cancer.  The patient's father had small cell lung cancer and was a smoker.  He had one brother who died of old age.  His parents are deceased from non-cancer related issues.  The patient's mother died of non-small cell lung cancer.  She had three brothers, one who had colon cancer at 7.  The maternal grandparents are deceased.  The grandmother had probable colon cancer in that she had rectal bleeding but died before she had a diagnosis.  Rhonda Strickland is unaware of previous family history of genetic testing for hereditary cancer risks. Patient's maternal ancestors are of [place] and [place] descent, and paternal ancestors are of  [place] descent. There is no reported Ashkenazi Jewish ancestry. There is no known consanguinity.    GENETIC COUNSELING ASSESSMENT: Rhonda Strickland is a 55 y.o. female with a personal and family history of cancer which is somewhat suggestive of a hereditary cancer syndrome and predisposition to cancer given the combination of cancer and her young age of onset. We, therefore, discussed and recommended the following at today's visit.   DISCUSSION: We discussed that 5 - 10% of cancer is hereditary.  Each type of cancer has it's own risk for being hereditary. For instance, about 5-7% of colon cancer is hereditary, with most cases associated with Lynch syndrome.  We do see cases of ovarian cancer within families with Lynch syndrome, however, the patient's age of onset of ovarian cancer is a bit younger than what is typical.  Similarly, ovarian cancer can also be seen in [place], but it a bit younger than what is typical.  Ovarian cancer can be seen in young adults.  Sometimes young ovarian cancer can be due to germ cell tumors or stromal cell tumors.  In some cases, DICER1 mutations are involved. Therefore, genetic testing is warranted to learn more about her diagnosis of ovarian cancer and the family history of colon cancer.  We reviewed the characteristics, features and inheritance patterns of hereditary cancer syndromes. We also discussed genetic testing, including the appropriate family members to test, the process of testing, insurance coverage and turn-around-time for results. We discussed the implications of a negative, positive, carrier and/or variant of uncertain significant result. We recommended Ms. Forgione pursue genetic testing for the CancerNext-Expanded+RNAinsight gene panel. The CancerNext-Expanded gene panel offered by [facility] and includes sequencing and rearrangement analysis for the following 77 genes: AIP, ALK, APC*, ATM*, AXIN2, BAP1, BARD1, BLM, BMPR1A, BRCA1*, BRCA2*, BRIP1*, CDC73, CDH1*,  CDK4, CDKN1B, CDKN2A, CHEK2*, CTNNA1, DICER1, FANCC, FH, FLCN, GALNT12, KIF1B, LZTR1, MAX, MEN1, MET, MLH1*, MSH2*, MSH3, MSH6*, MUTYH*, NBN, NF1*, NF2, NTHL1, PALB2*, PHOX2B, PMS2*, POT1, PRKAR1A, PTCH1, PTEN*, RAD51C*, RAD51D*, RB1, RECQL, RET, SDHA, SDHAF2, SDHB, SDHC, SDHD, SMAD4, SMARCA4, SMARCB1, SMARCE1, STK11, SUFU, TMEM127, TP53*, TSC1, TSC2, VHL and XRCC2 (sequencing and deletion/duplication); EGFR, EGLN1, HOXB13, KIT, MITF, PDGFRA, POLD1, and POLE (sequencing only); EPCAM and GREM1 (deletion/duplication only). DNA and RNA analyses performed for * genes.   Based on Ms. Stefan's personal and family history of cancer, she meets medical criteria for genetic testing. Despite that she meets criteria, she may still have an out of pocket cost. We discussed that if her out of pocket cost for testing is over $100, the laboratory will call  and confirm whether she wants to proceed with testing.  If the out of pocket cost of testing is less than $100 she will be billed by the genetic testing laboratory.   PLAN: After considering the risks, benefits, and limitations, Ms. Henne provided informed consent to pursue genetic testing and the blood sample was sent to [facility] for analysis of the CancerNext-Expanded+RNAinsight. Results should be available within approximately 2-3 weeks' time, at which point they will be disclosed by telephone to Ms. Mostek, as will any additional recommendations warranted by these results. Ms. Gell will receive a summary of her genetic counseling visit and a copy of her results once available. This information will also be available in Epic.   Lastly, we encouraged Ms. Mazzie to remain in contact with cancer genetics annually so that we can continuously update the family history and inform her of any changes in cancer genetics and testing that may be of benefit for this family.   Ms. Badour questions were answered to her satisfaction today. Our contact  information was provided should additional questions or concerns arise. Thank you for the referral and allowing [place] to share in the care of your patient.   Verniece Encarnacion P. Florene Glen, [place], [facility] Licensed, Insurance risk surveyor [email or web address] phone: [phone]  The patient was seen for a total of 35 minutes in face-to-face genetic counseling.  This patient was discussed with Drs. Magrinat, Lindi Adie and/or Burr Medico who agrees with the above.    _______________________________________________________________________ For Office Staff:  Number of people involved in session: 1 Was an Intern/ student involved with case: yes Fredrik Rigger

## 2020-02-13 ENCOUNTER — Inpatient Hospital Stay: Payer: Self-pay

## 2020-03-08 ENCOUNTER — Telehealth: Payer: Self-pay | Admitting: Genetic Counselor

## 2020-03-08 NOTE — Telephone Encounter (Signed)
Spoke with patient about missing her blood draw appointment and whether she wanted to r/s that draw.  She stated she would need to get back with me as she was currently in the hospital with her son.  I will wait for her call.

## 2020-03-26 ENCOUNTER — Telehealth: Payer: Self-pay | Admitting: Genetic Counselor

## 2020-03-26 NOTE — Telephone Encounter (Signed)
LM on VM that I was calling to r/s her appointment for a blood draw.  I asked that she give me a call to r/s, and if I had not heard from her we will cancel the order.  We can always reinstate it at a different time if she becomes interested again.

## 2020-10-04 ENCOUNTER — Other Ambulatory Visit: Payer: Self-pay | Admitting: Family Medicine

## 2020-10-04 DIAGNOSIS — Z1231 Encounter for screening mammogram for malignant neoplasm of breast: Secondary | ICD-10-CM

## 2020-11-27 ENCOUNTER — Ambulatory Visit
Admission: RE | Admit: 2020-11-27 | Discharge: 2020-11-27 | Disposition: A | Discharge status: Home / Self Care | Payer: PRIVATE HEALTH INSURANCE | Source: Ambulatory Visit | Attending: Family Medicine | Admitting: Family Medicine

## 2020-11-27 ENCOUNTER — Other Ambulatory Visit: Payer: Self-pay

## 2020-11-27 DIAGNOSIS — Z1231 Encounter for screening mammogram for malignant neoplasm of breast: Secondary | ICD-10-CM

## 2021-09-03 ENCOUNTER — Other Ambulatory Visit: Payer: Self-pay | Admitting: Family Medicine

## 2021-09-03 DIAGNOSIS — Z1231 Encounter for screening mammogram for malignant neoplasm of breast: Secondary | ICD-10-CM

## 2021-11-28 ENCOUNTER — Ambulatory Visit: Payer: PRIVATE HEALTH INSURANCE

## 2023-03-31 IMAGING — MG MM DIGITAL SCREENING BILAT W/ TOMO AND CAD
8 series · 9 of 24 positions shown · non-contrast
Comparison: Previous exam(s).

CLINICAL DATA: Screening.

EXAM:
DIGITAL SCREENING BILATERAL MAMMOGRAM WITH TOMOSYNTHESIS AND CAD
TECHNIQUE: Bilateral screening digital craniocaudal and mediolateral oblique
mammograms were obtained. Bilateral screening digital breast
tomosynthesis was performed. The images were evaluated with
computer-aided detection.

[R CC synth-2D]
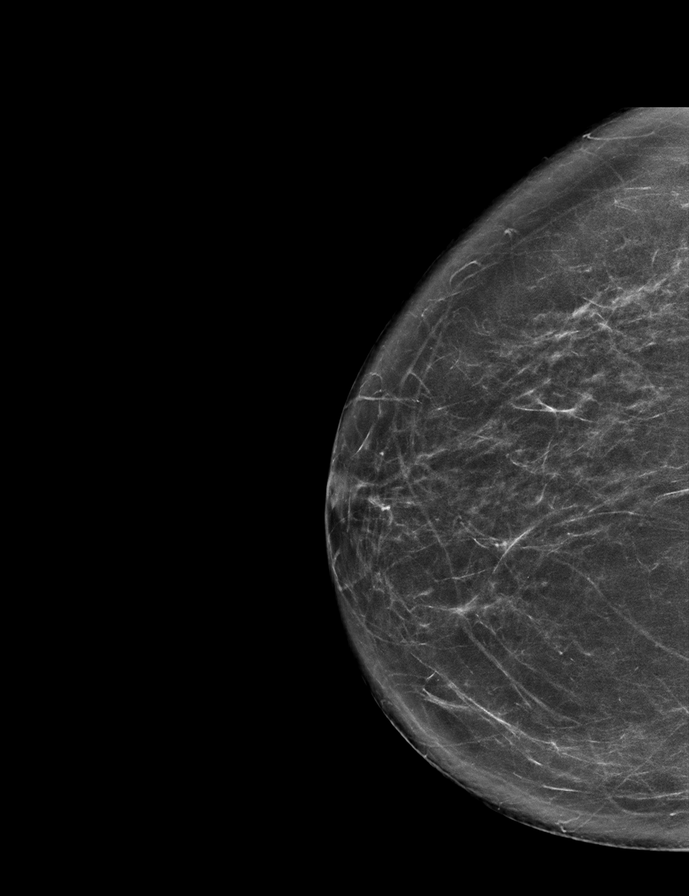

[L CC synth-2D]
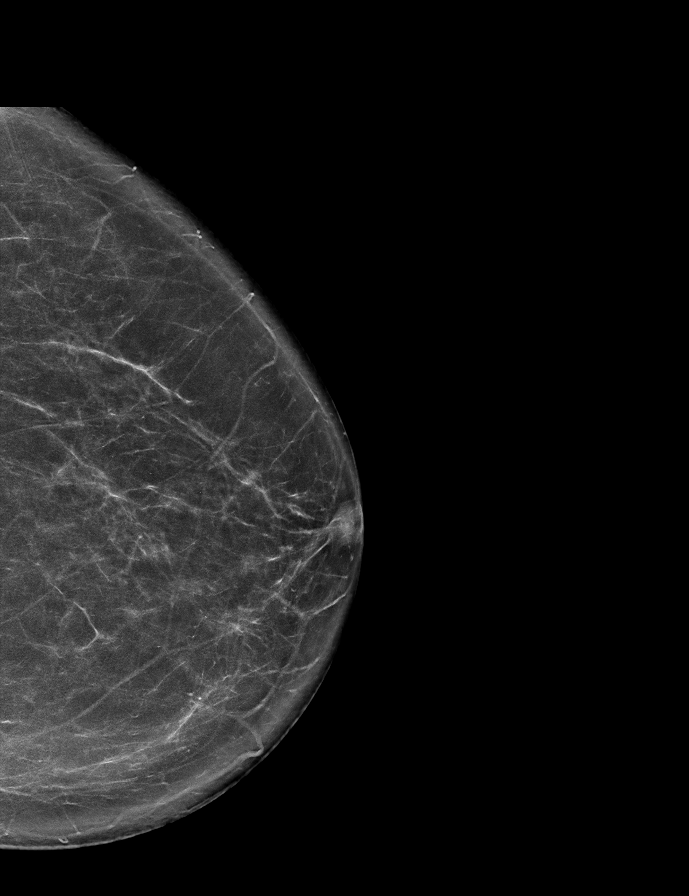

[R MLO synth-2D]
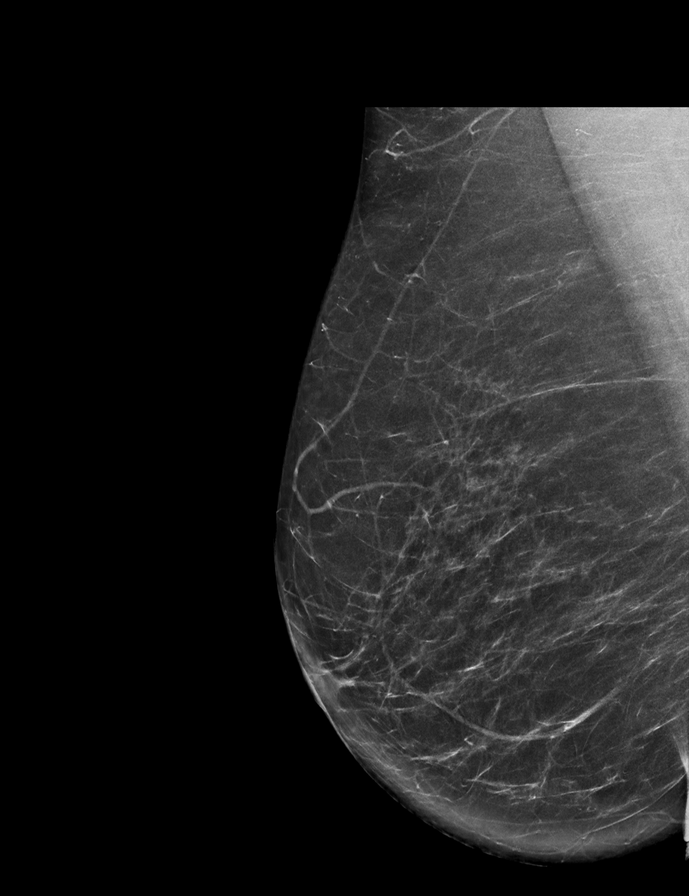

[L MLO synth-2D]
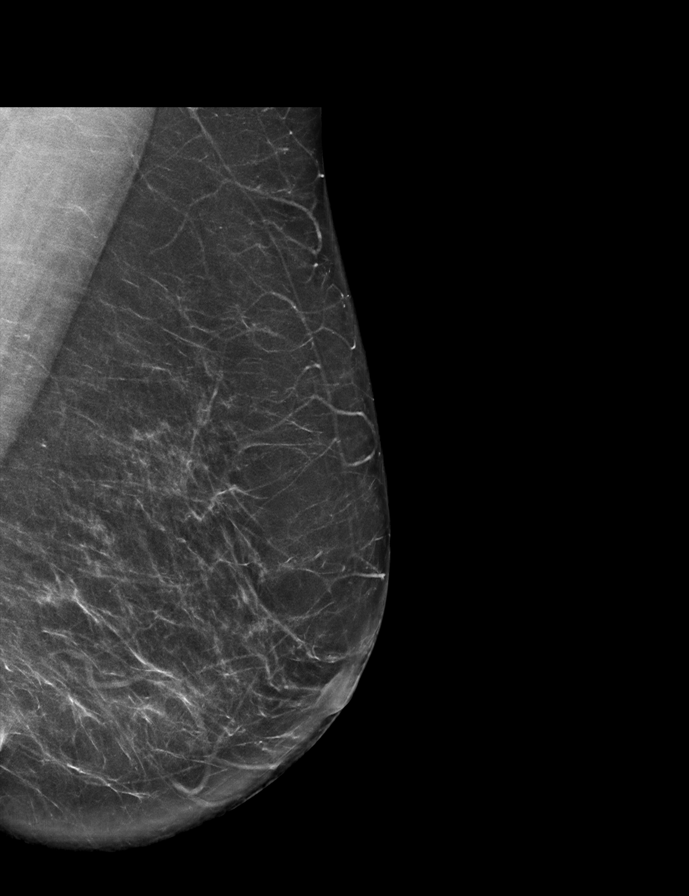

[R MLO tomo · 2 of 74 frames shown]
[frame 24/74]
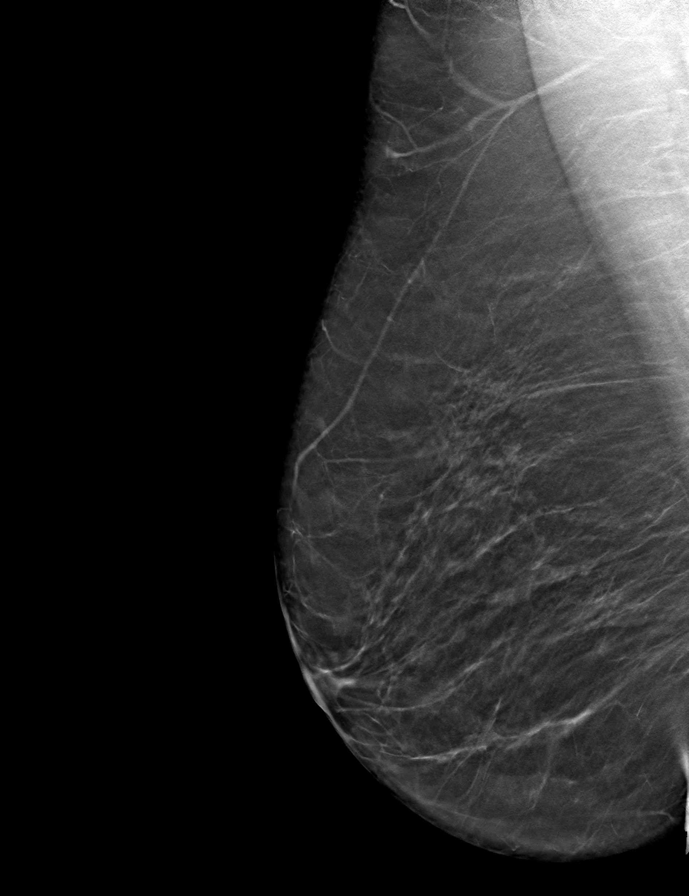
[frame 37/74]
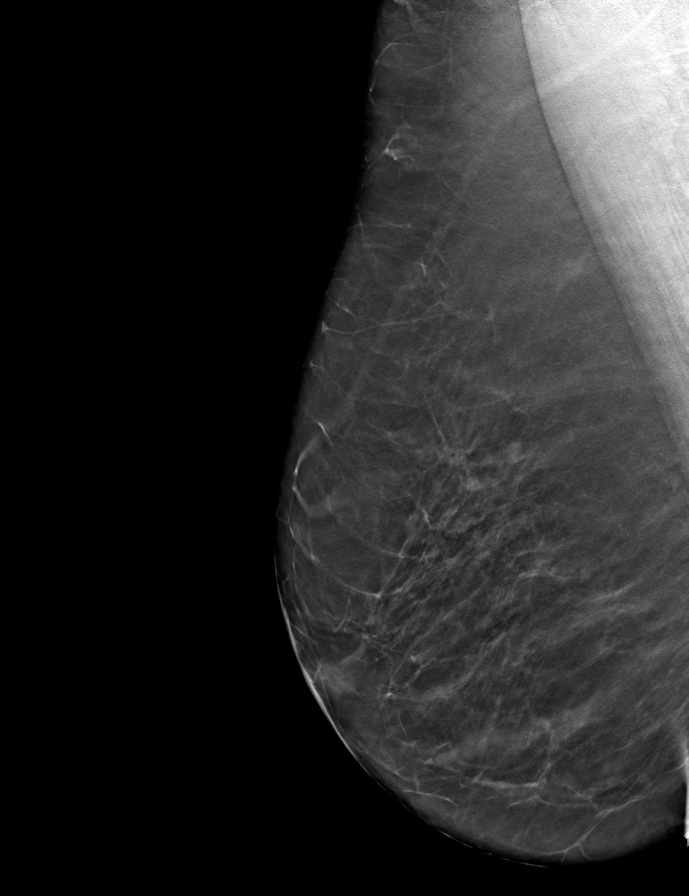

[L CC tomo · tomo slice 37/73.0]
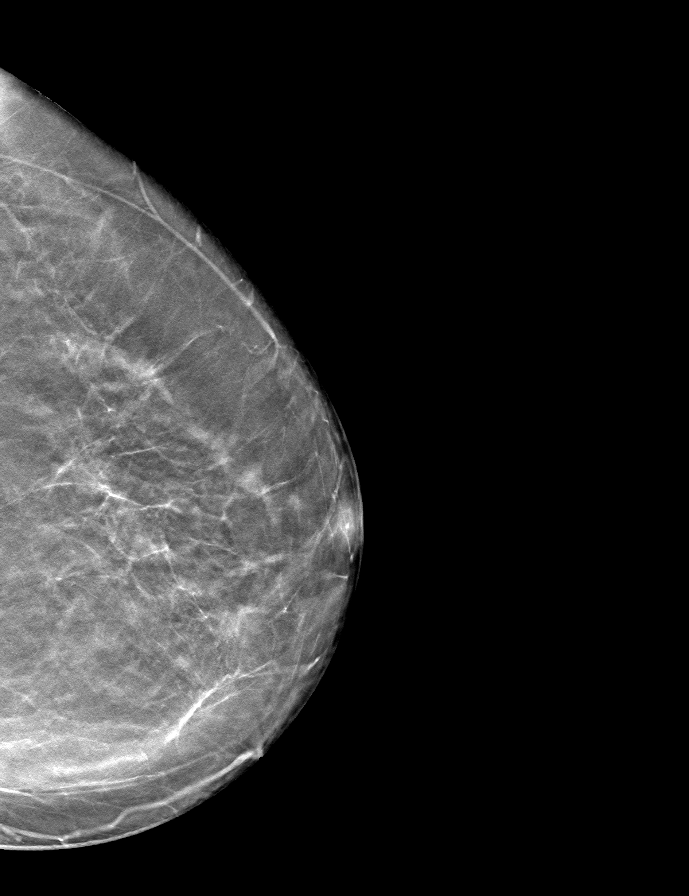

[R CC tomo · tomo slice 35/69.0]
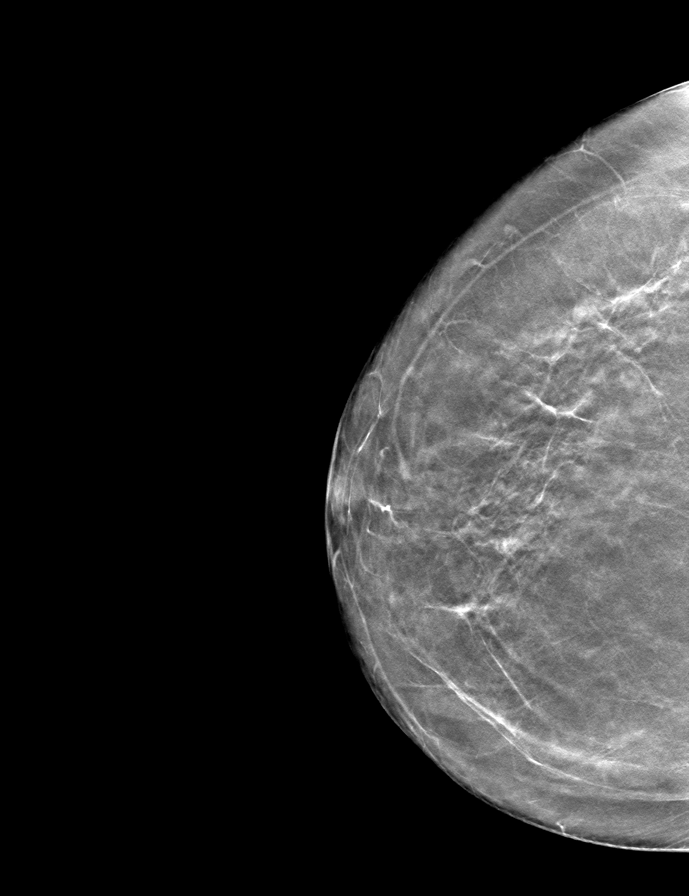

[L MLO tomo · tomo slice 39/76.0]
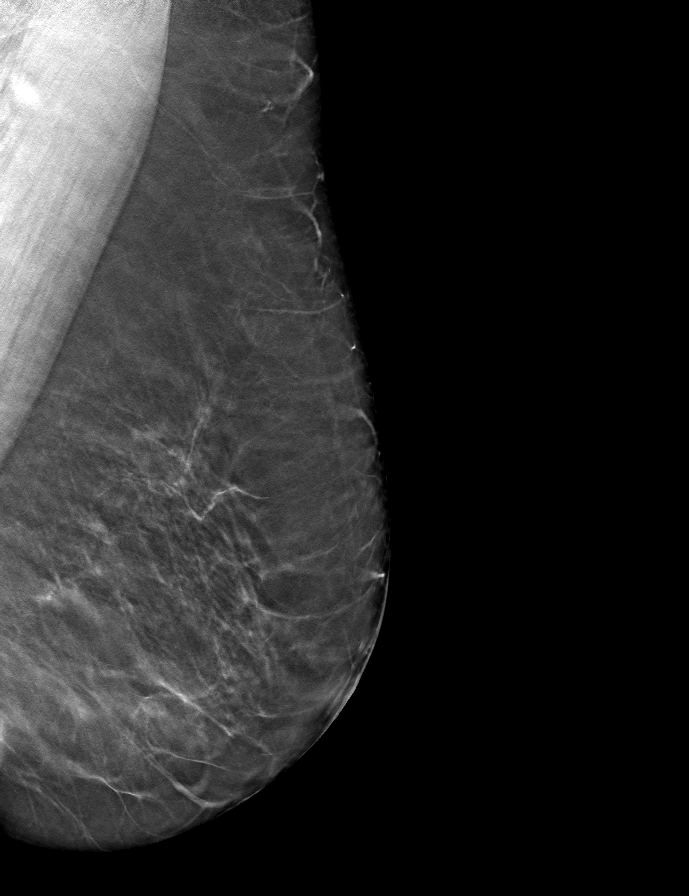

[9 of 24 positions shown; findings below may reference images not displayed]

ACR Breast Density Category b: There are scattered areas of
fibroglandular density.
FINDINGS: There are no findings suspicious for malignancy.
IMPRESSION: No mammographic evidence of malignancy. A result letter of this
screening mammogram will be mailed directly to the patient.

RECOMMENDATION:
Screening mammogram in one year. (Code:51-O-LD2)

BI-RADS CATEGORY  1: Negative.

## 2023-10-29 ENCOUNTER — Other Ambulatory Visit: Payer: Self-pay | Admitting: Family Medicine

## 2023-10-29 DIAGNOSIS — Z1231 Encounter for screening mammogram for malignant neoplasm of breast: Secondary | ICD-10-CM

## 2023-10-31 ENCOUNTER — Other Ambulatory Visit (HOSPITAL_BASED_OUTPATIENT_CLINIC_OR_DEPARTMENT_OTHER): Payer: Self-pay | Admitting: Family Medicine

## 2023-10-31 DIAGNOSIS — Z780 Asymptomatic menopausal state: Secondary | ICD-10-CM

## 2023-11-04 ENCOUNTER — Other Ambulatory Visit (HOSPITAL_COMMUNITY): Payer: Self-pay | Admitting: Family Medicine

## 2023-11-04 DIAGNOSIS — Z136 Encounter for screening for cardiovascular disorders: Secondary | ICD-10-CM

## 2023-11-05 ENCOUNTER — Other Ambulatory Visit (HOSPITAL_BASED_OUTPATIENT_CLINIC_OR_DEPARTMENT_OTHER): Payer: Self-pay | Admitting: Family Medicine

## 2023-11-05 DIAGNOSIS — Z136 Encounter for screening for cardiovascular disorders: Secondary | ICD-10-CM

## 2023-11-27 ENCOUNTER — Encounter (HOSPITAL_BASED_OUTPATIENT_CLINIC_OR_DEPARTMENT_OTHER): Payer: Self-pay

## 2023-11-27 ENCOUNTER — Other Ambulatory Visit (HOSPITAL_BASED_OUTPATIENT_CLINIC_OR_DEPARTMENT_OTHER): Payer: PRIVATE HEALTH INSURANCE

## 2023-12-11 ENCOUNTER — Ambulatory Visit
Admission: RE | Admit: 2023-12-11 | Discharge: 2023-12-11 | Disposition: A | Discharge status: Home / Self Care | Payer: PRIVATE HEALTH INSURANCE | Source: Ambulatory Visit | Attending: Family Medicine | Admitting: Family Medicine

## 2023-12-11 DIAGNOSIS — Z1231 Encounter for screening mammogram for malignant neoplasm of breast: Secondary | ICD-10-CM
